# Patient Record
Sex: Male | Born: 1945 | Race: Black or African American | Hispanic: No | Marital: Married | Smoking: Never smoker
Health system: Southern US, Community
[De-identification: ages and names within clinical notes are randomized; demographics above are authoritative.]

---

## 2015-04-14 ENCOUNTER — Other Ambulatory Visit: Payer: Self-pay | Admitting: Internal Medicine

## 2015-04-14 DIAGNOSIS — R945 Abnormal results of liver function studies: Principal | ICD-10-CM

## 2015-04-14 DIAGNOSIS — R7989 Other specified abnormal findings of blood chemistry: Secondary | ICD-10-CM

## 2015-04-23 ENCOUNTER — Ambulatory Visit (HOSPITAL_COMMUNITY)
Admission: RE | Admit: 2015-04-23 | Discharge: 2015-04-23 | Disposition: A | Discharge status: Home / Self Care | Payer: Medicare Other | Source: Ambulatory Visit | Attending: Internal Medicine | Admitting: Internal Medicine

## 2015-04-23 ENCOUNTER — Ambulatory Visit (HOSPITAL_COMMUNITY): Payer: Medicare Other

## 2015-04-23 DIAGNOSIS — R7989 Other specified abnormal findings of blood chemistry: Secondary | ICD-10-CM | POA: Diagnosis not present

## 2015-04-23 DIAGNOSIS — R945 Abnormal results of liver function studies: Secondary | ICD-10-CM

## 2015-09-01 ENCOUNTER — Encounter: Payer: Self-pay | Admitting: Podiatry

## 2015-09-01 ENCOUNTER — Telehealth: Payer: Self-pay | Admitting: *Deleted

## 2015-09-01 ENCOUNTER — Ambulatory Visit (INDEPENDENT_AMBULATORY_CARE_PROVIDER_SITE_OTHER): Payer: Medicare Other | Admitting: Podiatry

## 2015-09-01 VITALS — BP 122/83 | HR 67 | Resp 16 | Ht 71.0 in | Wt 180.0 lb

## 2015-09-01 DIAGNOSIS — M79676 Pain in unspecified toe(s): Secondary | ICD-10-CM | POA: Diagnosis not present

## 2015-09-01 DIAGNOSIS — B351 Tinea unguium: Secondary | ICD-10-CM

## 2015-09-01 NOTE — Progress Notes (Signed)
   Subjective:    Patient ID: Devin Byrd, male    DOB: 10/02/1946, 69 y.o.   MRN: [id number]  HPI: He presents today concerned about thickening of his toenails over the last 3 months he states discoloration has gotten worse and they're becoming more tender. He denies any trauma to the toes. He denies any other nail plates developing fungus particularly on his hands.  Review of Systems  All other systems reviewed and are negative.      Objective:   Physical Exam: 69 year old male in no acute distress vital signs stable alert and oriented 3. Pulses are strongly palpable bilateral. Neurologic sensorium is intact for Semmes-Weinstein monofilament. Deep tendon reflexes are intact bilateral muscle strength +5 over 5 dorsiflexion plantar flexors and inverters everters all just musculatures intact. Orthopedic evaluation of his result joints distal to the angle full range of motion without crepitation. Cutaneous evaluation demonstrates supple well-hydrated cutis. His nails are thick yellow dystrophic with mycotic painful on palpation.        Assessment & Plan:  Nail dystrophy hallux bilateral.  Plan: Samples of the nail were taken today to be sent for pathologic evaluation will follow up with him when the results come in.

## 2015-09-01 NOTE — Telephone Encounter (Signed)
Toenail fragments sent to [facility] for definitive diagnosis of fungal elements.

## 2015-09-24 ENCOUNTER — Telehealth: Payer: Self-pay | Admitting: *Deleted

## 2015-09-24 NOTE — Telephone Encounter (Signed)
Dr. Al Corpus reviewed pt's fungal culture +, and request pt make an appt to discuss treatment.  Orders to pt and he states he will call back when he knows his schedule.

## 2015-10-06 ENCOUNTER — Encounter: Payer: Self-pay | Admitting: Podiatry

## 2015-10-06 ENCOUNTER — Ambulatory Visit (INDEPENDENT_AMBULATORY_CARE_PROVIDER_SITE_OTHER): Payer: Medicare Other | Admitting: Podiatry

## 2015-10-06 VITALS — BP 126/69 | HR 78 | Resp 16

## 2015-10-06 DIAGNOSIS — B351 Tinea unguium: Secondary | ICD-10-CM

## 2015-10-06 DIAGNOSIS — Z79899 Other long term (current) drug therapy: Secondary | ICD-10-CM | POA: Diagnosis not present

## 2015-10-06 MED ORDER — TERBINAFINE HCL 250 MG PO TABS: 250.0000 mg | ORAL_TABLET | Freq: Every day | ORAL | Status: DC

## 2015-10-06 NOTE — Progress Notes (Signed)
He presents today for follow-up of lab work regarding his toenails.  Objective: Vital signs are stable he is alert and oriented 3. Pathology report does demonstrate fungus to his toenails hallux bilateral.  Assessment: Onychomycosis bilateral.  Plan: We discussed his past medical history consistent with a non-viral hepatitis. At this point topicals or laser will be his best choice for treatment. We scheduled him today for laser therapy and dispensed a bottle of formula 3.

## 2015-10-19 ENCOUNTER — Telehealth: Payer: Self-pay | Admitting: *Deleted

## 2015-10-19 NOTE — Telephone Encounter (Signed)
Pt states he and Dr. Al CorpusHyatt had discussed laser, and topical medications and he wanted to know why a oral medication was called in to [facility].  I reviewed pt's record and did not see that we had ordered any prescriptive medications for him.  I called pt's home phone and a male answered and stated pt had an appt tomorrow, and was not available to speak to me at this time.

## 2015-10-20 ENCOUNTER — Ambulatory Visit: Payer: Medicare Other | Admitting: Podiatry

## 2016-04-06 IMAGING — US US ABDOMEN COMPLETE
1 series · 14 of 25 positions shown · non-contrast
Comparison: None.

CLINICAL DATA: Elevated LFTs.

EXAM:
ULTRASOUND ABDOMEN COMPLETE

[Series 1: us abdomen complete · 0.19mm/px · 14 of 96 slices shown]
[im 1/96]
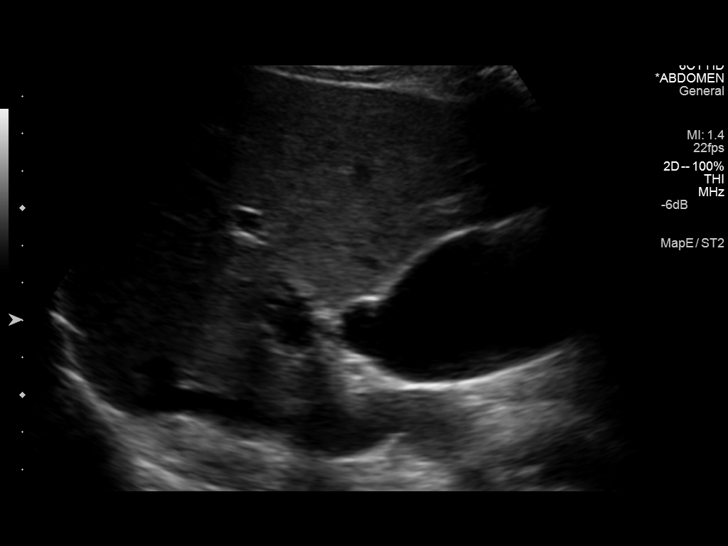
[im 8/96]
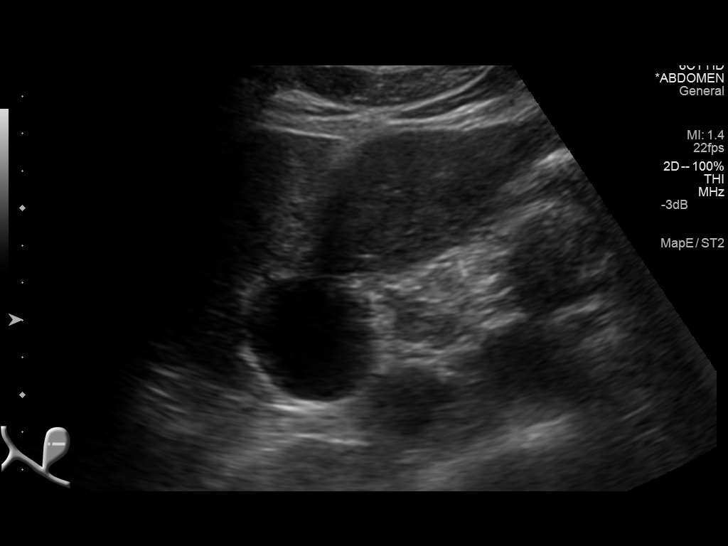
[im 16/96]
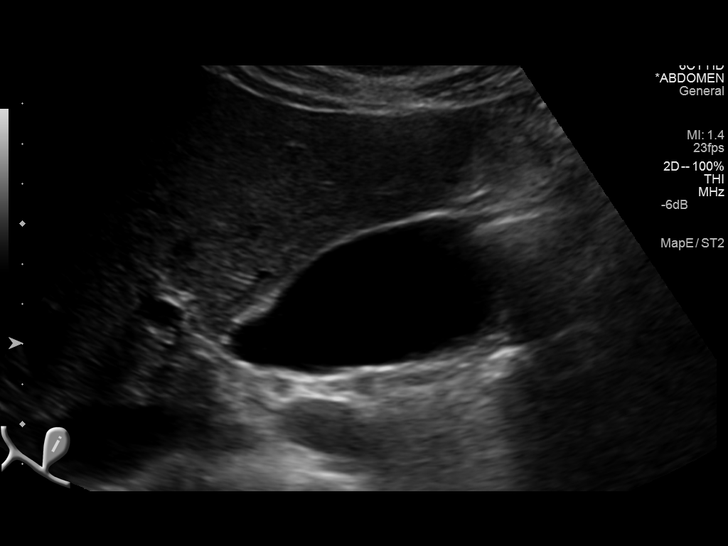
[im 24/96]
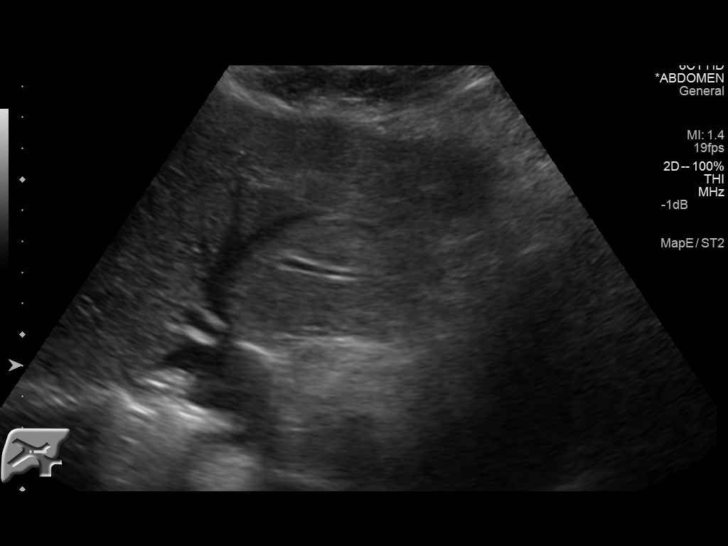
[im 32/96]
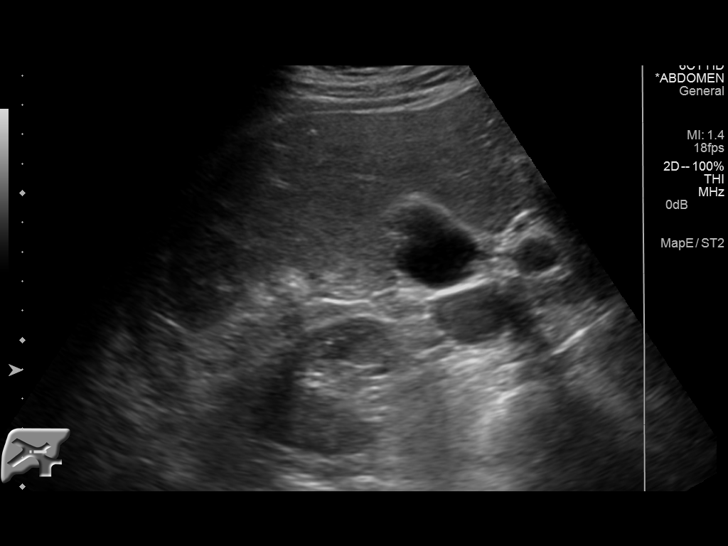
[im 36/96]
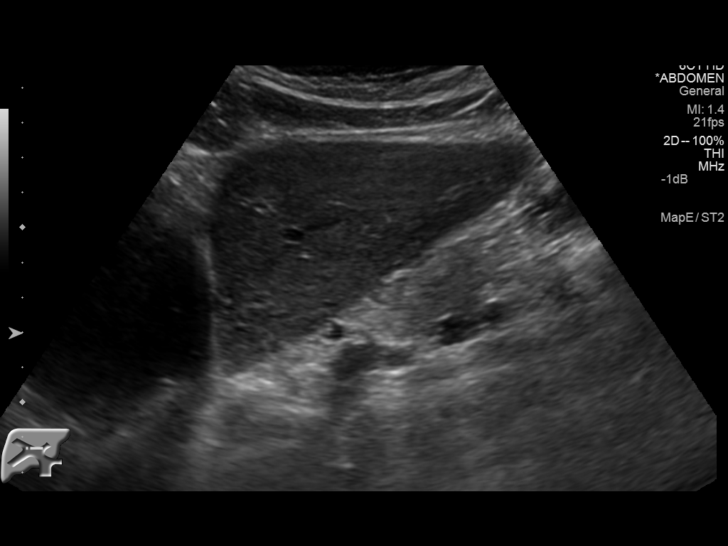
[im 44/96]
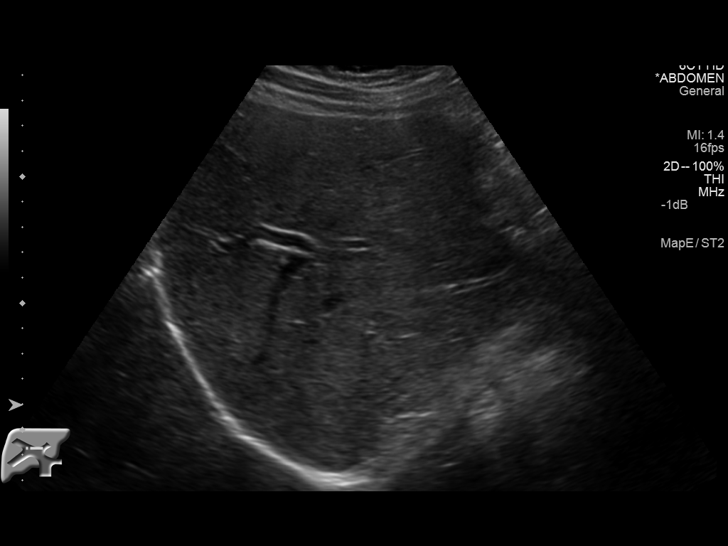
[im 52/96]
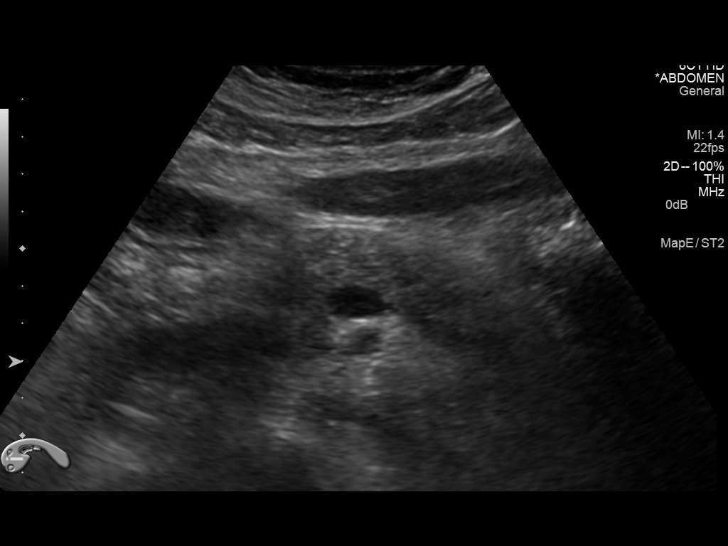
[im 60/96]
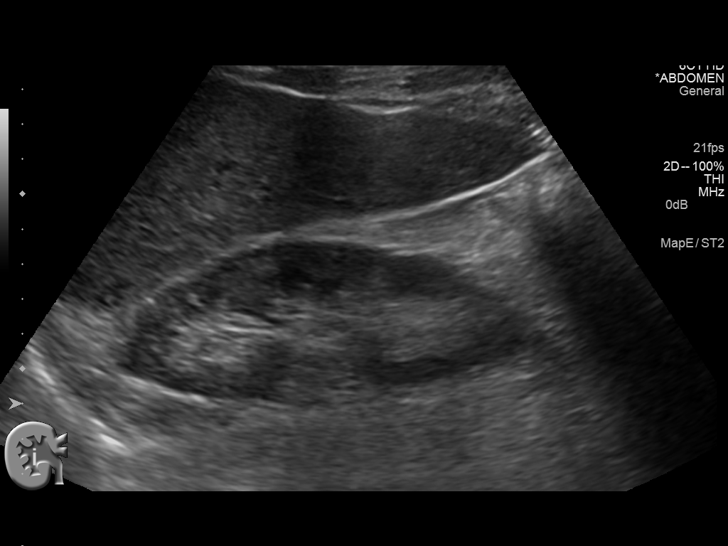
[im 64/96]
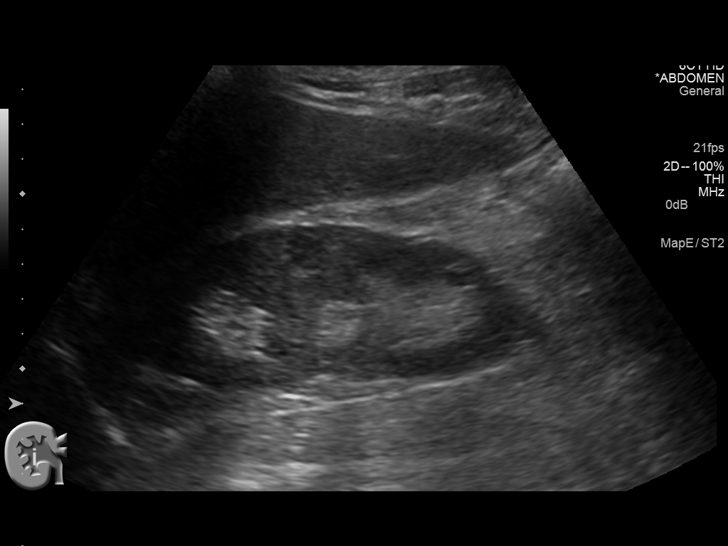
[im 72/96]
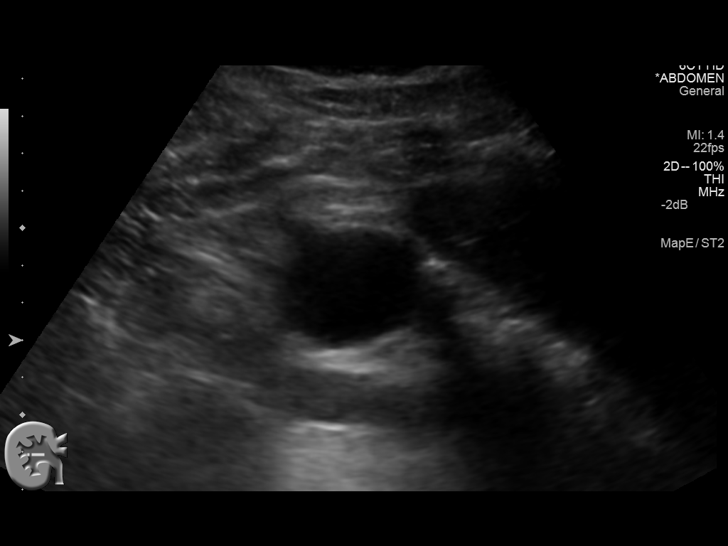
[im 80/96]
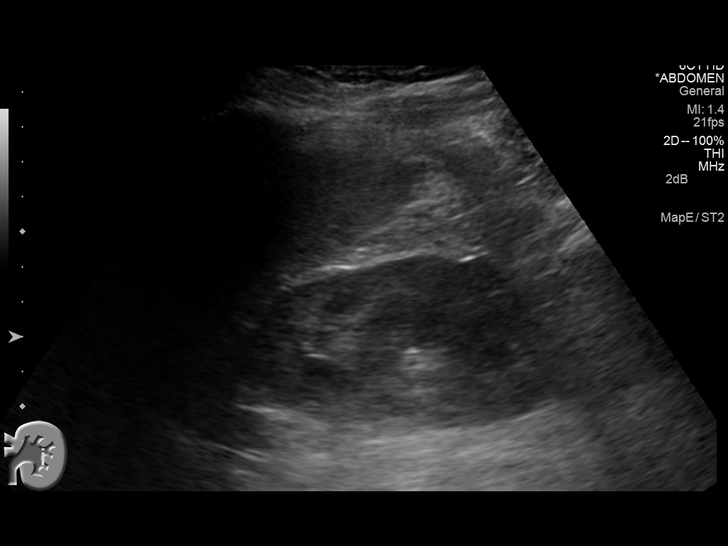
[im 88/96]
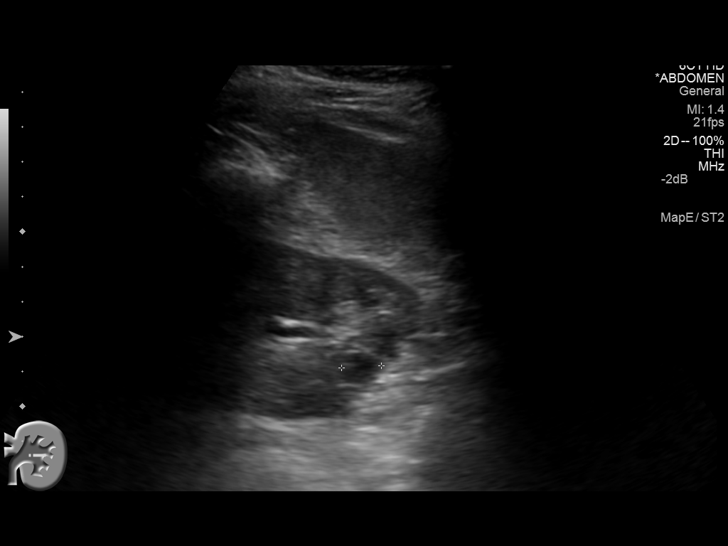
[im 96/96]
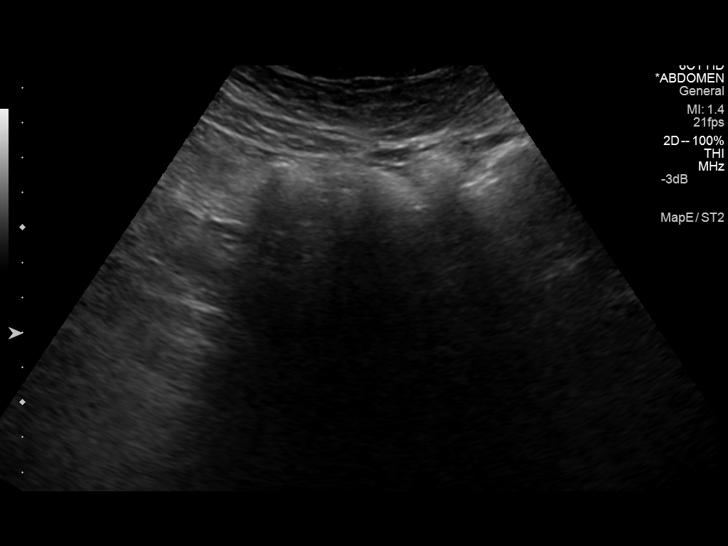

[14 of 25 positions shown; findings below may reference images not displayed]

FINDINGS: Gallbladder: No gallstones or wall thickening visualized. No
sonographic Murphy sign noted.

Common bile duct: Diameter: 4.4 mm

Liver: No focal lesion identified. Within normal limits in
parenchymal echogenicity.

IVC: No abnormality visualized.

Pancreas: Visualized portion unremarkable.

Spleen: Size and appearance within normal limits.

Right Kidney: Length: 11.7 cm. Echogenicity within normal limits. No
mass or hydronephrosis visualized. 3.5 cm simple cyst lower pole
right kidney.

Left Kidney: Length: 11.2 cm. Echogenicity within normal limits. No
mass or hydronephrosis visualized.

Abdominal aorta: No aneurysm visualized.

Other findings: None.
IMPRESSION: No significant abnormality identified.  No gallstones noted.
